# Patient Record
Sex: Male | Born: 1970 | Race: Black or African American | Hispanic: No | Marital: Married | Smoking: Never smoker
Health system: Southern US, Community
[De-identification: ages and names within clinical notes are randomized; demographics above are authoritative.]

## PROBLEM LIST (undated history)

## (undated) DIAGNOSIS — I10 Essential (primary) hypertension: Secondary | ICD-10-CM

---

## 2004-11-17 ENCOUNTER — Emergency Department: Payer: Self-pay | Admitting: Emergency Medicine

## 2004-11-25 ENCOUNTER — Encounter: Payer: Self-pay | Admitting: Family Medicine

## 2004-12-12 ENCOUNTER — Encounter: Payer: Self-pay | Admitting: Family Medicine

## 2009-06-08 ENCOUNTER — Emergency Department (HOSPITAL_COMMUNITY): Admission: EM | Admit: 2009-06-08 | Discharge: 2009-06-08 | Payer: Self-pay | Admitting: Emergency Medicine

## 2010-09-14 ENCOUNTER — Emergency Department (HOSPITAL_COMMUNITY)
Admission: EM | Admit: 2010-09-14 | Discharge: 2010-09-14 | Payer: Self-pay | Source: Home / Self Care | Admitting: Emergency Medicine

## 2010-11-23 LAB — DIFFERENTIAL
Basophils Absolute: 0.1 10*3/uL (ref 0.0–0.1)
Basophils Relative: 3 % — ABNORMAL HIGH (ref 0–1)
Eosinophils Absolute: 0.2 10*3/uL (ref 0.0–0.7)
Eosinophils Relative: 5 % (ref 0–5)
Lymphocytes Relative: 44 % (ref 12–46)
Lymphs Abs: 1.8 10*3/uL (ref 0.7–4.0)
Monocytes Absolute: 0.4 10*3/uL (ref 0.1–1.0)
Monocytes Relative: 10 % (ref 3–12)
Neutro Abs: 1.6 10*3/uL — ABNORMAL LOW (ref 1.7–7.7)
Neutrophils Relative %: 38 % — ABNORMAL LOW (ref 43–77)

## 2010-11-23 LAB — URINALYSIS, ROUTINE W REFLEX MICROSCOPIC
Bilirubin Urine: NEGATIVE
Glucose, UA: NEGATIVE mg/dL
Ketones, ur: NEGATIVE mg/dL
Leukocytes, UA: NEGATIVE
Nitrite: NEGATIVE
Protein, ur: NEGATIVE mg/dL
Specific Gravity, Urine: 1.02 (ref 1.005–1.030)
Urobilinogen, UA: 0.2 mg/dL (ref 0.0–1.0)
pH: 6 (ref 5.0–8.0)

## 2010-11-23 LAB — CBC
HCT: 42.8 % (ref 39.0–52.0)
Hemoglobin: 15 g/dL (ref 13.0–17.0)
MCH: 28.5 pg (ref 26.0–34.0)
MCHC: 35 g/dL (ref 30.0–36.0)
MCV: 81.4 fL (ref 78.0–100.0)
Platelets: 129 10*3/uL — ABNORMAL LOW (ref 150–400)
RBC: 5.26 MIL/uL (ref 4.22–5.81)
RDW: 14.3 % (ref 11.5–15.5)
WBC: 4.2 10*3/uL (ref 4.0–10.5)

## 2010-11-23 LAB — URINE CULTURE
Colony Count: 2000
Culture  Setup Time: 201201021224

## 2010-11-23 LAB — BASIC METABOLIC PANEL
BUN: 6 mg/dL (ref 6–23)
CO2: 26 mEq/L (ref 19–32)
Calcium: 9.5 mg/dL (ref 8.4–10.5)
Chloride: 104 mEq/L (ref 96–112)
Creatinine, Ser: 1.03 mg/dL (ref 0.4–1.5)
GFR calc Af Amer: >60 mL/min (ref >60)
GFR calc non Af Amer: >60 mL/min (ref >60)
Glucose, Bld: 120 mg/dL — ABNORMAL HIGH (ref 70–99)
Potassium: 4.4 mEq/L (ref 3.5–5.1)
Sodium: 136 mEq/L (ref 135–145)

## 2010-11-23 LAB — URINE MICROSCOPIC-ADD ON

## 2011-10-17 ENCOUNTER — Emergency Department (HOSPITAL_COMMUNITY): Payer: Self-pay

## 2011-10-17 ENCOUNTER — Emergency Department (HOSPITAL_COMMUNITY)
Admission: EM | Admit: 2011-10-17 | Discharge: 2011-10-17 | Disposition: A | Discharge status: Home / Self Care | Payer: Self-pay | Attending: Emergency Medicine | Admitting: Emergency Medicine

## 2011-10-17 ENCOUNTER — Encounter (HOSPITAL_COMMUNITY): Payer: Self-pay | Admitting: *Deleted

## 2011-10-17 DIAGNOSIS — R599 Enlarged lymph nodes, unspecified: Secondary | ICD-10-CM | POA: Insufficient documentation

## 2011-10-17 DIAGNOSIS — R591 Generalized enlarged lymph nodes: Secondary | ICD-10-CM

## 2011-10-17 DIAGNOSIS — R1310 Dysphagia, unspecified: Secondary | ICD-10-CM | POA: Insufficient documentation

## 2011-10-17 DIAGNOSIS — I10 Essential (primary) hypertension: Secondary | ICD-10-CM | POA: Insufficient documentation

## 2011-10-17 DIAGNOSIS — R070 Pain in throat: Secondary | ICD-10-CM | POA: Insufficient documentation

## 2011-10-17 HISTORY — DX: Essential (primary) hypertension: I10

## 2011-10-17 MED ORDER — HYDROCODONE-ACETAMINOPHEN 5-325 MG PO TABS: ORAL_TABLET | ORAL | Status: AC

## 2011-10-17 MED ORDER — IOHEXOL 300 MG/ML  SOLN
75.0000 mL | Freq: Once | INTRAMUSCULAR | Status: AC | PRN
  Administered 2011-10-17: 75 mL via INTRAVENOUS

## 2011-10-17 MED ORDER — NAPROXEN 500 MG PO TABS: 500.0000 mg | ORAL_TABLET | Freq: Two times a day (BID) | ORAL | Status: AC

## 2011-10-17 NOTE — ED Notes (Signed)
Pt states that it feels like something has been stuck in his throat x 1 week. Denies problems swallowing, eating or drinking. Denies sore throat. Pt states that it started suddenly and he was not eating at the time.

## 2011-10-17 NOTE — ED Provider Notes (Signed)
History     CSN: [id number]  Arrival date & time 10/17/11  0940   First MD Initiated Contact with Patient 10/17/11 1006      Chief Complaint  Patient presents with  . Oral Swelling    (Consider location/radiation/quality/duration/timing/severity/associated sxs/prior treatment) HPI Comments: Patient c/o foreign body sensation in this throat for approximately one week.  States that "it feels like something in my throat when I swallow".  He denies recent illness, pain with swallowing or eating fish recently.  He states the sensation is similar with fluids or solid food.  He denies vomiting.  He also denies neck pain.  States he tried OTC anti-acid w/o relief  Patient is a 41 y.o. male presenting with pharyngitis. The history is provided by the patient. No language interpreter was used.  Sore Throat This is a new problem. The current episode started 1 to 4 weeks ago. The problem occurs constantly. The problem has been unchanged. Associated symptoms include a sore throat. Pertinent negatives include no abdominal pain, chest pain, congestion, coughing, fever, headaches, joint swelling, nausea, neck pain, numbness, rash, swollen glands, vomiting or weakness. The symptoms are aggravated by swallowing. He has tried nothing for the symptoms. The treatment provided mild relief.    Past Medical History  Diagnosis Date  . Hypertension     History reviewed. No pertinent past surgical history.  History reviewed. No pertinent family history.  History  Substance Use Topics  . Smoking status: Never Smoker   . Smokeless tobacco: Not on file  . Alcohol Use: No      Review of Systems  Constitutional: Negative for fever, activity change and appetite change.  HENT: Positive for sore throat and trouble swallowing. Negative for congestion, facial swelling, rhinorrhea, sneezing, drooling, neck pain, neck stiffness, dental problem and sinus pressure.   Respiratory: Negative for cough.     Cardiovascular: Negative for chest pain.  Gastrointestinal: Negative for nausea, vomiting and abdominal pain.  Musculoskeletal: Negative for joint swelling.  Skin: Negative for rash.  Neurological: Negative for weakness, numbness and headaches.  Hematological: Negative for adenopathy.  All other systems reviewed and are negative.    Allergies  Review of patient's allergies indicates no known allergies.  Home Medications   Current Outpatient Rx  Name Route Sig Dispense Refill  . LISINOPRIL 40 MG PO TABS Oral Take 40 mg by mouth daily.    Marland Kitchen RANITIDINE HCL 150 MG PO TABS Oral Take 150 mg by mouth daily as needed. For heartburn      BP 147/78  Pulse 73  Temp(Src) 98.2 F (36.8 C) (Oral)  Resp 18  Ht 5\' 7"  (1.702 m)  Wt 240 lb (108.863 kg)  BMI 37.59 kg/m2  SpO2 98%  Physical Exam  Nursing note and vitals reviewed. Constitutional: He is oriented to person, place, and time. He appears well-developed and well-nourished. No distress.  HENT:  Head: Normocephalic and atraumatic. No trismus in the jaw.  Mouth/Throat: Uvula is midline, oropharynx is clear and moist and mucous membranes are normal. No oral lesions. No uvula swelling, lacerations or dental caries.       Oropharynx appears nml.  No FB seen  Neck: Normal range of motion. Neck supple. No tracheal deviation present. No thyromegaly present.  Cardiovascular: Normal rate, regular rhythm and normal heart sounds.   No murmur heard. Pulmonary/Chest: Effort normal and breath sounds normal. No stridor. No respiratory distress. He exhibits no tenderness.  Abdominal: Soft. He exhibits no distension. There is no tenderness.  Musculoskeletal: Normal range of motion. He exhibits no tenderness.  Lymphadenopathy:    He has no cervical adenopathy.  Neurological: He is alert and oriented to person, place, and time. He exhibits normal muscle tone. Coordination normal.  Skin: Skin is warm and dry.    ED Course  Procedures (including  critical care time)   Labs Reviewed  TSH   Ct Soft Tissue Neck W Contrast  10/17/2011  *RADIOLOGY REPORT*  Clinical Data: Oral swelling.  The patient feels like he has something stuck in his throat for the past week.  CT NECK WITH CONTRAST  Technique:  Multidetector CT imaging of the neck was performed with intravenous contrast.  Contrast: 75mL OMNIPAQUE IOHEXOL 300 MG/ML IV SOLN  Comparison: None.  Findings: No focal mucosal or submucosal lesions are present.  The vocal cords are midline and symmetric.  The tongue base is unremarkable.  Enlarged right level II lymph nodes are present.  The largest node measures 2.8 x 2.0 cm on the sagittal reformats.  A slightly smaller and more superior node is 2.5 x 1.5 cm.  Just superior to that node is a 2.0 x 1.4 cm node.  No significant left-sided adenopathy is present.  Smaller posterior right level II lymph nodes are present.  No significant lower adenopathy is evident.  The thyroid gland is within normal limits.  Limited imaging of the brain is unremarkable.  The lung apices are clear.  The bone windows are unremarkable.  IMPRESSION:  1.  Enlarged right level II adenopathy.  This raises concern for neoplasm.  Inflammatory adenopathy could have this appearance, but appears fairly localized.  A primary lymphoid neoplasm or dysplasia is also considered. 2.  No primary mucosal lesion is evident.  Original Report Authenticated By: Jamesetta Orleans. MATTERN, M.D.    [facility] results are pending    MDM    Patient has remained stable during emergency department visit.  Vitals also stable. No acute distress.  Airway remains patent. No edema.  Handles secretions well.  I have advised the patient of the CT findings today and he agrees to close followup with ENT. Care plan was discussed with EDP prior to d/c   Patient / Family / Caregiver understand and agree with initial ED impression and plan with expectations set for ED visit.      Jshawn Hurta L. Makynli Stills, [place] 10/19/11  1601

## 2011-10-18 LAB — TSH: TSH: 0.88 u[IU]/mL (ref 0.350–4.500)

## 2011-10-19 NOTE — ED Provider Notes (Signed)
Medical screening examination/treatment/procedure(s) were performed by non-physician practitioner and as supervising physician I was immediately available for consultation/collaboration.   Benny Lennert, MD 10/19/11 1622

## 2012-04-09 ENCOUNTER — Emergency Department (HOSPITAL_COMMUNITY)
Admission: EM | Admit: 2012-04-09 | Discharge: 2012-04-09 | Disposition: A | Discharge status: Home / Self Care | Payer: Self-pay | Attending: Emergency Medicine | Admitting: Emergency Medicine

## 2012-04-09 ENCOUNTER — Encounter (HOSPITAL_COMMUNITY): Payer: Self-pay

## 2012-04-09 DIAGNOSIS — R591 Generalized enlarged lymph nodes: Secondary | ICD-10-CM

## 2012-04-09 DIAGNOSIS — R220 Localized swelling, mass and lump, head: Secondary | ICD-10-CM | POA: Insufficient documentation

## 2012-04-09 DIAGNOSIS — I10 Essential (primary) hypertension: Secondary | ICD-10-CM | POA: Insufficient documentation

## 2012-04-09 MED ORDER — AMOXICILLIN 500 MG PO CAPS: 500.0000 mg | ORAL_CAPSULE | Freq: Three times a day (TID) | ORAL | Status: AC

## 2012-04-09 NOTE — ED Provider Notes (Signed)
History     CSN: [id number]  Arrival date & time 04/09/12  1143   First MD Initiated Contact with Patient 04/09/12 1229      Chief Complaint  Patient presents with  . Neck Pain    (Consider location/radiation/quality/duration/timing/severity/associated sxs/prior treatment) HPI Comments: Patient c/o pain to the right side of his neck for 2 days.  Also reports having an occasional cough and slight nasal congestion for several days.  States the  Pain to his neck is worse with movement and improves with rest.  He denies fever, sore throat, difficulty swallowing or breathing.    The history is provided by the patient.    Past Medical History  Diagnosis Date  . Hypertension     History reviewed. No pertinent past surgical history.  No family history on file.  History  Substance Use Topics  . Smoking status: Never Smoker   . Smokeless tobacco: Not on file  . Alcohol Use: No      Review of Systems  Constitutional: Negative for fever, chills, activity change, appetite change, fatigue and unexpected weight change.  HENT: Positive for congestion and neck pain. Negative for ear pain, sore throat, facial swelling, mouth sores, trouble swallowing, neck stiffness, dental problem, voice change, sinus pressure and ear discharge.   Respiratory: Negative for cough, chest tightness, shortness of breath, wheezing and stridor.   Cardiovascular: Negative for chest pain.  Gastrointestinal: Negative for nausea and vomiting.  Musculoskeletal: Negative for arthralgias.  Skin: Negative for color change.  Neurological: Negative for dizziness, facial asymmetry, speech difficulty, light-headedness and headaches.  Hematological: Positive for adenopathy. Does not bruise/bleed easily.  Psychiatric/Behavioral: Negative for confusion and decreased concentration.  All other systems reviewed and are negative.    Allergies  Review of patient's allergies indicates no known allergies.  Home  Medications   Current Outpatient Rx  Name Route Sig Dispense Refill  . LISINOPRIL 40 MG PO TABS Oral Take 40 mg by mouth daily.    Marland Kitchen NAPROXEN 500 MG PO TABS Oral Take 1 tablet (500 mg total) by mouth 2 (two) times daily. Take with food 20 tablet 0  . RANITIDINE HCL 150 MG PO TABS Oral Take 150 mg by mouth daily as needed. For heartburn      BP 134/94  Pulse 69  Temp 98.5 F (36.9 C) (Oral)  Resp 18  Ht 5\' 6"  (1.676 m)  Wt 230 lb (104.327 kg)  BMI 37.12 kg/m2  SpO2 100%  Physical Exam  Nursing note and vitals reviewed. Constitutional: He is oriented to person, place, and time. He appears well-developed and well-nourished. No distress.  HENT:  Head: Normocephalic and atraumatic. No trismus in the jaw.  Right Ear: Tympanic membrane and ear canal normal. No mastoid tenderness. No hemotympanum.  Left Ear: Tympanic membrane and ear canal normal. No mastoid tenderness. No hemotympanum.  Nose: Nose normal. Right sinus exhibits no maxillary sinus tenderness and no frontal sinus tenderness. Left sinus exhibits no maxillary sinus tenderness and no frontal sinus tenderness.  Mouth/Throat: Uvula is midline, oropharynx is clear and moist and mucous membranes are normal. No uvula swelling or dental caries.  Neck: Full passive range of motion without pain and phonation normal. No JVD present. No tracheal tenderness and no spinous process tenderness present. Carotid bruit is not present. No rigidity. No tracheal deviation, no edema, no erythema and normal range of motion present. No Brudzinski's sign and no Kernig's sign noted. Thyromegaly present.  Cardiovascular: Normal rate, regular rhythm, normal heart  sounds and intact distal pulses.   No murmur heard. Pulmonary/Chest: Effort normal and breath sounds normal. No stridor.  Musculoskeletal: He exhibits no edema.  Lymphadenopathy:    He has cervical adenopathy.       Right cervical: Superficial cervical and posterior cervical adenopathy present.         Left cervical: No superficial cervical, no deep cervical and no posterior cervical adenopathy present.    He has no axillary adenopathy.       Right: No supraclavicular adenopathy present.       Left: No supraclavicular adenopathy present.       three moderately enlg lymph nodes to the right anterior and posterior neck.    Neurological: He is alert and oriented to person, place, and time. He exhibits normal muscle tone. Coordination normal.  Skin: Skin is dry.    ED Course  Procedures (including critical care time)  Labs Reviewed - No data to display      MDM    Multiple dime sized palpable nodules to the right neck.  Thyromegaly.  I have advised pt to have further eval of the nodules including labs and CT scan of the soft tissues of the neck, but he refused stated that he prefers to try antibiotics first. Requests an abx that's on the $4.00 formulary.   He agrees to return here if the sx's are not improving.  I have discussed the diff diagnosis with him including possibility of CA.  He verbalized understanding of this to me and agreed to return or have close f/u with his PMD if sx's do not improve with use of abx.  He is non-toxic appearing, vital are stable and appears stable for d/c at this time.    The patient appears reasonably screened and/or stabilized for discharge and I doubt any other medical condition or other [facility] requiring further screening, evaluation, or treatment in the ED at this time prior to discharge.   Prescribed:  Naprosyn amoxil         Shoshanna Mcquitty L. Lamarion Mcevers, [place] 04/14/12 1627

## 2012-04-09 NOTE — ED Notes (Signed)
Pt reports enlarged lymph nodes on r side of neck x 2 days.  Reports has had a nonproductive cough for the past 3 or 4 days.  Denies sore throat.

## 2012-04-19 NOTE — ED Provider Notes (Signed)
Medical screening examination/treatment/procedure(s) were conducted as a shared visit with non-physician practitioner(s) and myself.  I personally evaluated the patient during the encounter.  Patient understands that right sided neck nodules could be secondary to cancer. He understands that a CT scan needs to be performed.  He takes full responsibility for followup for his neck nodule  Donnetta Hutching, MD 04/19/12 [phone]

## 2012-05-29 ENCOUNTER — Other Ambulatory Visit (HOSPITAL_COMMUNITY): Payer: Self-pay | Admitting: *Deleted

## 2012-05-29 DIAGNOSIS — I889 Nonspecific lymphadenitis, unspecified: Secondary | ICD-10-CM

## 2012-06-12 ENCOUNTER — Ambulatory Visit (HOSPITAL_COMMUNITY)
Admission: RE | Admit: 2012-06-12 | Discharge: 2012-06-12 | Disposition: A | Discharge status: Home / Self Care | Payer: BC Managed Care – PPO | Source: Ambulatory Visit | Attending: General Practice | Admitting: General Practice

## 2012-06-12 ENCOUNTER — Other Ambulatory Visit (HOSPITAL_COMMUNITY): Payer: Self-pay | Admitting: *Deleted

## 2012-06-12 DIAGNOSIS — I889 Nonspecific lymphadenitis, unspecified: Secondary | ICD-10-CM

## 2012-07-03 ENCOUNTER — Ambulatory Visit: Payer: Self-pay | Admitting: Hematology and Oncology

## 2012-07-05 ENCOUNTER — Ambulatory Visit: Payer: Self-pay | Admitting: Hematology and Oncology

## 2012-07-05 LAB — COMPREHENSIVE METABOLIC PANEL
Albumin: 4.1 g/dL (ref 3.4–5.0)
Alkaline Phosphatase: 73 U/L (ref 50–136)
Anion Gap: 8 (ref 7–16)
BUN: 10 mg/dL (ref 7–18)
Bilirubin,Total: 0.5 mg/dL (ref 0.2–1.0)
Calcium, Total: 8.9 mg/dL (ref 8.5–10.1)
Chloride: 108 mmol/L — ABNORMAL HIGH (ref 98–107)
Co2: 26 mmol/L (ref 21–32)
Creatinine: 1 mg/dL (ref 0.60–1.30)
EGFR (African American): >60
EGFR (Non-African Amer.): >60
Glucose: 112 mg/dL — ABNORMAL HIGH (ref 65–99)
Osmolality: 283 (ref 275–301)
Potassium: 4 mmol/L (ref 3.5–5.1)
SGOT(AST): 25 U/L (ref 15–37)
SGPT (ALT): 26 U/L (ref 12–78)
Sodium: 142 mmol/L (ref 136–145)
Total Protein: 7.8 g/dL (ref 6.4–8.2)

## 2012-07-05 LAB — CBC CANCER CENTER
Basophil: 1 %
Comment - H1-Com2: NORMAL
Eosinophil: 10 %
HCT: 43.6 % (ref 40.0–52.0)
HGB: 14.2 g/dL (ref 13.0–18.0)
Lymphocytes: 43 %
MCH: 27.9 pg (ref 26.0–34.0)
MCHC: 32.5 g/dL (ref 32.0–36.0)
MCV: 86 fL (ref 80–100)
Monocytes: 7 %
Platelet: 189 x10 3/mm (ref 150–440)
RBC: 5.08 10*6/uL (ref 4.40–5.90)
RDW: 14.1 % (ref 11.5–14.5)
Segmented Neutrophils: 34 %
Variant Lymphocyte: 5 %
WBC: 5.6 x10 3/mm (ref 3.8–10.6)

## 2012-07-05 LAB — DIFFERENTIAL
Basophil: 1 %
Comment - H1-Com2: NORMAL
Eosinophil: 10 %
Lymphocytes: 43 %
Monocytes: 7 %
Segmented Neutrophils: 34 %
Variant Lymphocyte - H1-Rlymph: 5 %

## 2012-07-14 ENCOUNTER — Ambulatory Visit: Payer: Self-pay | Admitting: Hematology and Oncology

## 2012-07-20 ENCOUNTER — Ambulatory Visit: Payer: Self-pay | Admitting: Hematology and Oncology

## 2012-07-21 LAB — COMPREHENSIVE METABOLIC PANEL
Albumin: 4.2 g/dL (ref 3.4–5.0)
Alkaline Phosphatase: 75 U/L (ref 50–136)
Anion Gap: 12 (ref 7–16)
BUN: 19 mg/dL — ABNORMAL HIGH (ref 7–18)
Bilirubin,Total: 0.6 mg/dL (ref 0.2–1.0)
Calcium, Total: 9.4 mg/dL (ref 8.5–10.1)
Chloride: 101 mmol/L (ref 98–107)
Co2: 26 mmol/L (ref 21–32)
Creatinine: 1.44 mg/dL — ABNORMAL HIGH (ref 0.60–1.30)
EGFR (African American): >60
EGFR (Non-African Amer.): 60 — ABNORMAL LOW
Glucose: 157 mg/dL — ABNORMAL HIGH (ref 65–99)
Osmolality: 283 (ref 275–301)
Potassium: 3.8 mmol/L (ref 3.5–5.1)
SGOT(AST): 22 U/L (ref 15–37)
SGPT (ALT): 31 U/L (ref 12–78)
Sodium: 139 mmol/L (ref 136–145)
Total Protein: 8.2 g/dL (ref 6.4–8.2)

## 2012-07-21 LAB — CBC CANCER CENTER
Basophil #: 0 x10 3/mm (ref 0.0–0.1)
Basophil %: 0.8 %
Eosinophil #: 0.3 x10 3/mm (ref 0.0–0.7)
Eosinophil %: 5 %
HCT: 45 % (ref 40.0–52.0)
HGB: 14.6 g/dL (ref 13.0–18.0)
Lymphocyte #: 3.2 x10 3/mm (ref 1.0–3.6)
Lymphocyte %: 52.5 %
MCH: 28 pg (ref 26.0–34.0)
MCHC: 32.5 g/dL (ref 32.0–36.0)
MCV: 86 fL (ref 80–100)
Monocyte #: 0.5 x10 3/mm (ref 0.2–1.0)
Monocyte %: 8.9 %
Neutrophil #: 2 x10 3/mm (ref 1.4–6.5)
Neutrophil %: 32.8 %
Platelet: 199 x10 3/mm (ref 150–440)
RBC: 5.23 10*6/uL (ref 4.40–5.90)
RDW: 14.6 % — ABNORMAL HIGH (ref 11.5–14.5)
WBC: 6.1 x10 3/mm (ref 3.8–10.6)

## 2012-08-07 ENCOUNTER — Ambulatory Visit: Payer: Self-pay | Admitting: Unknown Physician Specialty

## 2012-08-07 LAB — APTT: Activated PTT: 28.1 secs (ref 23.6–35.9)

## 2012-08-07 LAB — PROTIME-INR
INR: 0.9
Prothrombin Time: 12.9 secs (ref 11.5–14.7)

## 2012-08-13 ENCOUNTER — Ambulatory Visit: Payer: Self-pay | Admitting: Hematology and Oncology

## 2012-08-14 ENCOUNTER — Ambulatory Visit: Payer: Self-pay | Admitting: Unknown Physician Specialty

## 2012-08-16 LAB — PATHOLOGY REPORT

## 2012-08-18 LAB — CBC CANCER CENTER
Basophil #: 0.1 x10 3/mm (ref 0.0–0.1)
Basophil %: 0.7 %
Eosinophil #: 0.3 x10 3/mm (ref 0.0–0.7)
Eosinophil %: 3.6 %
HCT: 44.7 % (ref 40.0–52.0)
HGB: 15.3 g/dL (ref 13.0–18.0)
Lymphocyte #: 3.4 x10 3/mm (ref 1.0–3.6)
Lymphocyte %: 42.3 %
MCH: 28.7 pg (ref 26.0–34.0)
MCHC: 34.1 g/dL (ref 32.0–36.0)
MCV: 84 fL (ref 80–100)
Monocyte #: 0.8 x10 3/mm (ref 0.2–1.0)
Monocyte %: 9.9 %
Neutrophil #: 3.5 x10 3/mm (ref 1.4–6.5)
Neutrophil %: 43.5 %
Platelet: 222 x10 3/mm (ref 150–440)
RBC: 5.31 10*6/uL (ref 4.40–5.90)
RDW: 14.7 % — ABNORMAL HIGH (ref 11.5–14.5)
WBC: 8.1 x10 3/mm (ref 3.8–10.6)

## 2012-08-18 LAB — COMPREHENSIVE METABOLIC PANEL
Albumin: 4.5 g/dL (ref 3.4–5.0)
Alkaline Phosphatase: 99 U/L (ref 50–136)
Anion Gap: 14 (ref 7–16)
BUN: 14 mg/dL (ref 7–18)
Bilirubin,Total: 0.4 mg/dL (ref 0.2–1.0)
Calcium, Total: 10.1 mg/dL (ref 8.5–10.1)
Chloride: 100 mmol/L (ref 98–107)
Co2: 23 mmol/L (ref 21–32)
Creatinine: 1.37 mg/dL — ABNORMAL HIGH (ref 0.60–1.30)
EGFR (African American): >60
EGFR (Non-African Amer.): >60
Glucose: 267 mg/dL — ABNORMAL HIGH (ref 65–99)
Osmolality: 284 (ref 275–301)
Potassium: 4 mmol/L (ref 3.5–5.1)
SGOT(AST): 43 U/L — ABNORMAL HIGH (ref 15–37)
SGPT (ALT): 46 U/L (ref 12–78)
Sodium: 137 mmol/L (ref 136–145)
Total Protein: 8.7 g/dL — ABNORMAL HIGH (ref 6.4–8.2)

## 2012-08-18 LAB — PROTIME-INR
INR: 1
Prothrombin Time: 13.4 secs (ref 11.5–14.7)

## 2012-08-21 ENCOUNTER — Ambulatory Visit: Payer: Self-pay | Admitting: Anesthesiology

## 2012-08-22 ENCOUNTER — Ambulatory Visit: Payer: Self-pay | Admitting: Unknown Physician Specialty

## 2012-08-28 LAB — PATHOLOGY REPORT

## 2012-08-29 LAB — CBC CANCER CENTER
Basophil #: 0 x10 3/mm (ref 0.0–0.1)
Basophil %: 0.5 %
Eosinophil #: 0.2 x10 3/mm (ref 0.0–0.7)
Eosinophil %: 3.7 %
HCT: 39.1 % — ABNORMAL LOW (ref 40.0–52.0)
HGB: 13.5 g/dL (ref 13.0–18.0)
Lymphocyte #: 3.2 x10 3/mm (ref 1.0–3.6)
Lymphocyte %: 49.5 %
MCH: 29.1 pg (ref 26.0–34.0)
MCHC: 34.5 g/dL (ref 32.0–36.0)
MCV: 84 fL (ref 80–100)
Monocyte #: 0.5 x10 3/mm (ref 0.2–1.0)
Monocyte %: 8.4 %
Neutrophil #: 2.4 x10 3/mm (ref 1.4–6.5)
Neutrophil %: 37.9 %
Platelet: 232 x10 3/mm (ref 150–440)
RBC: 4.63 10*6/uL (ref 4.40–5.90)
RDW: 14.3 % (ref 11.5–14.5)
WBC: 6.4 x10 3/mm (ref 3.8–10.6)

## 2012-08-29 LAB — COMPREHENSIVE METABOLIC PANEL
Albumin: 3.8 g/dL (ref 3.4–5.0)
Alkaline Phosphatase: 85 U/L (ref 50–136)
Anion Gap: 10 (ref 7–16)
BUN: 10 mg/dL (ref 7–18)
Bilirubin,Total: 0.3 mg/dL (ref 0.2–1.0)
Calcium, Total: 8.8 mg/dL (ref 8.5–10.1)
Chloride: 102 mmol/L (ref 98–107)
Co2: 26 mmol/L (ref 21–32)
Creatinine: 1.09 mg/dL (ref 0.60–1.30)
EGFR (African American): >60
EGFR (Non-African Amer.): >60
Glucose: 236 mg/dL — ABNORMAL HIGH (ref 65–99)
Osmolality: 282 (ref 275–301)
Potassium: 4.1 mmol/L (ref 3.5–5.1)
SGOT(AST): 25 U/L (ref 15–37)
SGPT (ALT): 35 U/L (ref 12–78)
Sodium: 138 mmol/L (ref 136–145)
Total Protein: 7.8 g/dL (ref 6.4–8.2)

## 2012-08-29 LAB — LACTATE DEHYDROGENASE: LDH: 197 U/L (ref 85–241)

## 2012-08-30 LAB — COMPREHENSIVE METABOLIC PANEL
Albumin: 3.7 g/dL (ref 3.4–5.0)
Alkaline Phosphatase: 87 U/L (ref 50–136)
Anion Gap: 10 (ref 7–16)
BUN: 10 mg/dL (ref 7–18)
Bilirubin,Total: 0.4 mg/dL (ref 0.2–1.0)
Calcium, Total: 9.2 mg/dL (ref 8.5–10.1)
Chloride: 103 mmol/L (ref 98–107)
Co2: 26 mmol/L (ref 21–32)
Creatinine: 1.32 mg/dL — ABNORMAL HIGH (ref 0.60–1.30)
EGFR (African American): >60
EGFR (Non-African Amer.): >60
Glucose: 258 mg/dL — ABNORMAL HIGH (ref 65–99)
Osmolality: 285 (ref 275–301)
Potassium: 4 mmol/L (ref 3.5–5.1)
SGOT(AST): 21 U/L (ref 15–37)
SGPT (ALT): 38 U/L (ref 12–78)
Sodium: 139 mmol/L (ref 136–145)
Total Protein: 7.8 g/dL (ref 6.4–8.2)

## 2012-08-30 LAB — CBC CANCER CENTER
Comment - H1-Com2: NORMAL
Eosinophil: 1 %
HCT: 40.7 % (ref 40.0–52.0)
HGB: 13.7 g/dL (ref 13.0–18.0)
Lymphocytes: 19 %
MCH: 28.8 pg (ref 26.0–34.0)
MCHC: 33.6 g/dL (ref 32.0–36.0)
MCV: 86 fL (ref 80–100)
Monocytes: 4 %
Platelet: 244 x10 3/mm (ref 150–440)
RBC: 4.74 10*6/uL (ref 4.40–5.90)
RDW: 14.3 % (ref 11.5–14.5)
Segmented Neutrophils: 53 %
Variant Lymphocyte: 23 %
WBC: 6.7 x10 3/mm (ref 3.8–10.6)

## 2012-09-07 LAB — CBC CANCER CENTER
Basophil #: 0 x10 3/mm (ref 0.0–0.1)
Basophil %: 0.8 %
Eosinophil #: 0.1 x10 3/mm (ref 0.0–0.7)
Eosinophil %: 6.6 %
HCT: 41.1 % (ref 40.0–52.0)
HGB: 13.7 g/dL (ref 13.0–18.0)
Lymphocyte #: 1.2 x10 3/mm (ref 1.0–3.6)
Lymphocyte %: 61.1 %
MCH: 28.7 pg (ref 26.0–34.0)
MCHC: 33.2 g/dL (ref 32.0–36.0)
MCV: 86 fL (ref 80–100)
Monocyte #: 0.1 x10 3/mm — ABNORMAL LOW (ref 0.2–1.0)
Monocyte %: 5.1 %
Neutrophil #: 0.5 x10 3/mm — ABNORMAL LOW (ref 1.4–6.5)
Neutrophil %: 26.4 %
Platelet: 97 x10 3/mm — ABNORMAL LOW (ref 150–440)
RBC: 4.77 10*6/uL (ref 4.40–5.90)
RDW: 14.3 % (ref 11.5–14.5)
WBC: 2 x10 3/mm — CL (ref 3.8–10.6)

## 2012-09-07 LAB — COMPREHENSIVE METABOLIC PANEL
Albumin: 3.7 g/dL (ref 3.4–5.0)
Alkaline Phosphatase: 99 U/L (ref 50–136)
Anion Gap: 14 (ref 7–16)
BUN: 18 mg/dL (ref 7–18)
Bilirubin,Total: 0.9 mg/dL (ref 0.2–1.0)
Calcium, Total: 8.7 mg/dL (ref 8.5–10.1)
Chloride: 94 mmol/L — ABNORMAL LOW (ref 98–107)
Co2: 25 mmol/L (ref 21–32)
Creatinine: 1.67 mg/dL — ABNORMAL HIGH (ref 0.60–1.30)
EGFR (African American): 58 — ABNORMAL LOW
EGFR (Non-African Amer.): 50 — ABNORMAL LOW
Glucose: 488 mg/dL — ABNORMAL HIGH (ref 65–99)
Osmolality: 290 (ref 275–301)
Potassium: 4.4 mmol/L (ref 3.5–5.1)
SGOT(AST): 11 U/L — ABNORMAL LOW (ref 15–37)
SGPT (ALT): 25 U/L (ref 12–78)
Sodium: 133 mmol/L — ABNORMAL LOW (ref 136–145)
Total Protein: 7.5 g/dL (ref 6.4–8.2)

## 2012-09-13 ENCOUNTER — Ambulatory Visit: Payer: Self-pay | Admitting: Hematology and Oncology

## 2012-09-20 LAB — COMPREHENSIVE METABOLIC PANEL
Albumin: 3.8 g/dL (ref 3.4–5.0)
Anion Gap: 10 (ref 7–16)
BUN: 10 mg/dL (ref 7–18)
Bilirubin,Total: 0.1 mg/dL — ABNORMAL LOW (ref 0.2–1.0)
Chloride: 108 mmol/L — ABNORMAL HIGH (ref 98–107)
Co2: 25 mmol/L (ref 21–32)
EGFR (African American): >60
EGFR (Non-African Amer.): >60
Glucose: 117 mg/dL — ABNORMAL HIGH (ref 65–99)
Potassium: 3.9 mmol/L (ref 3.5–5.1)
SGOT(AST): 16 U/L (ref 15–37)
SGPT (ALT): 34 U/L (ref 12–78)
Sodium: 143 mmol/L (ref 136–145)
Total Protein: 6.9 g/dL (ref 6.4–8.2)

## 2012-09-20 LAB — CBC CANCER CENTER
Basophil #: 0.1 x10 3/mm (ref 0.0–0.1)
Eosinophil #: 0 x10 3/mm (ref 0.0–0.7)
HCT: 35 % — ABNORMAL LOW (ref 40.0–52.0)
HGB: 12 g/dL — ABNORMAL LOW (ref 13.0–18.0)
Lymphocyte #: 2.4 x10 3/mm (ref 1.0–3.6)
Lymphocyte %: 30.7 %
MCH: 28.9 pg (ref 26.0–34.0)
Monocyte #: 1 x10 3/mm (ref 0.2–1.0)
Neutrophil %: 55.5 %
Platelet: 175 x10 3/mm (ref 150–440)
RBC: 4.14 10*6/uL — ABNORMAL LOW (ref 4.40–5.90)
RDW: 13.9 % (ref 11.5–14.5)

## 2012-09-27 LAB — CBC CANCER CENTER
Basophil %: 0.9 %
Eosinophil #: 0 x10 3/mm (ref 0.0–0.7)
Eosinophil %: 0.8 %
Lymphocyte #: 1.4 x10 3/mm (ref 1.0–3.6)
MCH: 28.9 pg (ref 26.0–34.0)
Monocyte #: 0.2 x10 3/mm (ref 0.2–1.0)
Monocyte %: 4.5 %
Neutrophil #: 3.5 x10 3/mm (ref 1.4–6.5)
WBC: 5.2 x10 3/mm (ref 3.8–10.6)

## 2012-09-27 LAB — COMPREHENSIVE METABOLIC PANEL
BUN: 10 mg/dL (ref 7–18)
Chloride: 104 mmol/L (ref 98–107)
Glucose: 81 mg/dL (ref 65–99)
SGOT(AST): 8 U/L — ABNORMAL LOW (ref 15–37)
SGPT (ALT): 23 U/L (ref 12–78)
Total Protein: 7.2 g/dL (ref 6.4–8.2)

## 2012-10-11 LAB — COMPREHENSIVE METABOLIC PANEL
Albumin: 3.9 g/dL (ref 3.4–5.0)
Alkaline Phosphatase: 90 U/L (ref 50–136)
Anion Gap: 9 (ref 7–16)
Bilirubin,Total: 0.3 mg/dL (ref 0.2–1.0)
Calcium, Total: 9.3 mg/dL (ref 8.5–10.1)
Co2: 29 mmol/L (ref 21–32)
Creatinine: 1.1 mg/dL (ref 0.60–1.30)
Glucose: 147 mg/dL — ABNORMAL HIGH (ref 65–99)
SGOT(AST): 16 U/L (ref 15–37)
SGPT (ALT): 33 U/L (ref 12–78)
Sodium: 140 mmol/L (ref 136–145)
Total Protein: 7.2 g/dL (ref 6.4–8.2)

## 2012-10-11 LAB — CBC CANCER CENTER
Basophil %: 1.2 %
Eosinophil #: 0 x10 3/mm (ref 0.0–0.7)
Eosinophil %: 0.7 %
Lymphocyte #: 2.1 x10 3/mm (ref 1.0–3.6)
Lymphocyte %: 31.9 %
MCH: 28.7 pg (ref 26.0–34.0)
MCHC: 33.4 g/dL (ref 32.0–36.0)
MCV: 86 fL (ref 80–100)
Monocyte #: 1 x10 3/mm (ref 0.2–1.0)
Neutrophil %: 51.5 %
RBC: 4.22 10*6/uL — ABNORMAL LOW (ref 4.40–5.90)

## 2012-10-14 ENCOUNTER — Ambulatory Visit: Payer: Self-pay | Admitting: Hematology and Oncology

## 2012-11-01 LAB — CBC CANCER CENTER
Basophil #: 0.1 x10 3/mm (ref 0.0–0.1)
Basophil %: 1.3 %
Eosinophil #: 0.1 x10 3/mm (ref 0.0–0.7)
Lymphocyte #: 2 x10 3/mm (ref 1.0–3.6)
MCH: 29.6 pg (ref 26.0–34.0)
MCHC: 33.3 g/dL (ref 32.0–36.0)
MCV: 89 fL (ref 80–100)
Monocyte #: 1.3 x10 3/mm — ABNORMAL HIGH (ref 0.2–1.0)
Monocyte %: 17.4 %
Neutrophil #: 3.8 x10 3/mm (ref 1.4–6.5)
Platelet: 243 x10 3/mm (ref 150–440)
RBC: 3.92 10*6/uL — ABNORMAL LOW (ref 4.40–5.90)
RDW: 18.4 % — ABNORMAL HIGH (ref 11.5–14.5)
WBC: 7.3 x10 3/mm (ref 3.8–10.6)

## 2012-11-01 LAB — COMPREHENSIVE METABOLIC PANEL
Anion Gap: 9 (ref 7–16)
BUN: 8 mg/dL (ref 7–18)
Bilirubin,Total: 0.2 mg/dL (ref 0.2–1.0)
Chloride: 105 mmol/L (ref 98–107)
Co2: 27 mmol/L (ref 21–32)
Creatinine: 1.05 mg/dL (ref 0.60–1.30)
EGFR (African American): >60
EGFR (Non-African Amer.): >60
Osmolality: 283 (ref 275–301)
SGOT(AST): 14 U/L — ABNORMAL LOW (ref 15–37)
SGPT (ALT): 26 U/L (ref 12–78)
Sodium: 141 mmol/L (ref 136–145)
Total Protein: 7.1 g/dL (ref 6.4–8.2)

## 2012-11-11 ENCOUNTER — Ambulatory Visit: Payer: Self-pay | Admitting: Hematology and Oncology

## 2012-11-30 LAB — CBC CANCER CENTER
Basophil %: 0.8 %
HCT: 39.4 % — ABNORMAL LOW (ref 40.0–52.0)
HGB: 13.2 g/dL (ref 13.0–18.0)
Lymphocyte #: 1.1 x10 3/mm (ref 1.0–3.6)
MCH: 29.3 pg (ref 26.0–34.0)
MCHC: 33.4 g/dL (ref 32.0–36.0)
MCV: 88 fL (ref 80–100)
Monocyte #: 0.5 x10 3/mm (ref 0.2–1.0)
Monocyte %: 11 %
Neutrophil #: 2.4 x10 3/mm (ref 1.4–6.5)
Neutrophil %: 57.8 %
Platelet: 148 x10 3/mm — ABNORMAL LOW (ref 150–440)
RDW: 16.2 % — ABNORMAL HIGH (ref 11.5–14.5)

## 2012-11-30 LAB — COMPREHENSIVE METABOLIC PANEL
Albumin: 4 g/dL (ref 3.4–5.0)
Alkaline Phosphatase: 75 U/L (ref 50–136)
Anion Gap: 10 (ref 7–16)
BUN: 12 mg/dL (ref 7–18)
Bilirubin,Total: 0.4 mg/dL (ref 0.2–1.0)
Creatinine: 1.14 mg/dL (ref 0.60–1.30)
EGFR (African American): >60
Osmolality: 283 (ref 275–301)
SGOT(AST): 17 U/L (ref 15–37)
SGPT (ALT): 29 U/L (ref 12–78)
Sodium: 141 mmol/L (ref 136–145)

## 2012-12-07 LAB — CBC CANCER CENTER
Basophil #: 0 x10 3/mm (ref 0.0–0.1)
Eosinophil #: 0.1 x10 3/mm (ref 0.0–0.7)
Eosinophil %: 4.2 %
HCT: 38.7 % — ABNORMAL LOW (ref 40.0–52.0)
HGB: 12.9 g/dL — ABNORMAL LOW (ref 13.0–18.0)
Lymphocyte #: 0.4 x10 3/mm — ABNORMAL LOW (ref 1.0–3.6)
Lymphocyte %: 13.4 %
MCH: 29.2 pg (ref 26.0–34.0)
MCHC: 33.3 g/dL (ref 32.0–36.0)
Monocyte #: 0.5 x10 3/mm (ref 0.2–1.0)
Monocyte %: 14.8 %
Neutrophil #: 2.2 x10 3/mm (ref 1.4–6.5)
Neutrophil %: 66.8 %
RBC: 4.42 10*6/uL (ref 4.40–5.90)
RDW: 16.1 % — ABNORMAL HIGH (ref 11.5–14.5)
WBC: 3.2 x10 3/mm — ABNORMAL LOW (ref 3.8–10.6)

## 2012-12-12 ENCOUNTER — Ambulatory Visit: Payer: Self-pay | Admitting: Hematology and Oncology

## 2013-01-11 ENCOUNTER — Ambulatory Visit: Payer: Self-pay | Admitting: Hematology and Oncology

## 2013-01-11 LAB — COMPREHENSIVE METABOLIC PANEL
Albumin: 4.2 g/dL (ref 3.4–5.0)
Alkaline Phosphatase: 79 U/L (ref 50–136)
Anion Gap: 12 (ref 7–16)
BUN: 15 mg/dL (ref 7–18)
Calcium, Total: 9.6 mg/dL (ref 8.5–10.1)
Co2: 26 mmol/L (ref 21–32)
Creatinine: 1.36 mg/dL — ABNORMAL HIGH (ref 0.60–1.30)
EGFR (African American): >60
Glucose: 88 mg/dL (ref 65–99)
Potassium: 4 mmol/L (ref 3.5–5.1)
SGOT(AST): 20 U/L (ref 15–37)
SGPT (ALT): 37 U/L (ref 12–78)
Sodium: 143 mmol/L (ref 136–145)
Total Protein: 7.4 g/dL (ref 6.4–8.2)

## 2013-01-11 LAB — CBC CANCER CENTER
Eosinophil #: 0.2 x10 3/mm (ref 0.0–0.7)
HCT: 39.8 % — ABNORMAL LOW (ref 40.0–52.0)
HGB: 13 g/dL (ref 13.0–18.0)
Lymphocyte #: 1.2 x10 3/mm (ref 1.0–3.6)
MCH: 28 pg (ref 26.0–34.0)
MCV: 85 fL (ref 80–100)
Monocyte %: 15 %
RBC: 4.66 10*6/uL (ref 4.40–5.90)
WBC: 4.1 x10 3/mm (ref 3.8–10.6)

## 2013-01-12 LAB — PSA: PSA: 0.5 ng/mL (ref 0.0–4.0)

## 2013-02-11 ENCOUNTER — Ambulatory Visit: Payer: Self-pay | Admitting: Hematology and Oncology

## 2013-03-29 ENCOUNTER — Ambulatory Visit: Payer: Self-pay | Admitting: Hematology and Oncology

## 2013-03-30 ENCOUNTER — Ambulatory Visit: Payer: Self-pay | Admitting: Hematology and Oncology

## 2013-04-03 LAB — COMPREHENSIVE METABOLIC PANEL
Albumin: 4.1 g/dL (ref 3.4–5.0)
Alkaline Phosphatase: 83 U/L (ref 50–136)
Anion Gap: 4 — ABNORMAL LOW (ref 7–16)
BUN: 10 mg/dL (ref 7–18)
Bilirubin,Total: 0.4 mg/dL (ref 0.2–1.0)
Calcium, Total: 9.4 mg/dL (ref 8.5–10.1)
Chloride: 107 mmol/L (ref 98–107)
Co2: 29 mmol/L (ref 21–32)
Creatinine: 1.18 mg/dL (ref 0.60–1.30)
EGFR (African American): >60
EGFR (Non-African Amer.): >60
Glucose: 114 mg/dL — ABNORMAL HIGH (ref 65–99)
Osmolality: 279 (ref 275–301)
Potassium: 4.4 mmol/L (ref 3.5–5.1)
SGOT(AST): 15 U/L (ref 15–37)
SGPT (ALT): 25 U/L (ref 12–78)
Sodium: 140 mmol/L (ref 136–145)
Total Protein: 7.3 g/dL (ref 6.4–8.2)

## 2013-04-03 LAB — CBC CANCER CENTER
Basophil #: 0 x10 3/mm (ref 0.0–0.1)
Basophil %: 0.8 %
Eosinophil #: 0.1 x10 3/mm (ref 0.0–0.7)
Eosinophil %: 3.4 %
HCT: 41.2 % (ref 40.0–52.0)
HGB: 13.9 g/dL (ref 13.0–18.0)
Lymphocyte #: 1.1 x10 3/mm (ref 1.0–3.6)
Lymphocyte %: 32.6 %
MCH: 27.3 pg (ref 26.0–34.0)
MCHC: 33.7 g/dL (ref 32.0–36.0)
MCV: 81 fL (ref 80–100)
Monocyte #: 0.4 x10 3/mm (ref 0.2–1.0)
Monocyte %: 12.4 %
Neutrophil #: 1.7 x10 3/mm (ref 1.4–6.5)
Neutrophil %: 50.8 %
Platelet: 167 x10 3/mm (ref 150–440)
RBC: 5.1 10*6/uL (ref 4.40–5.90)
RDW: 16.7 % — ABNORMAL HIGH (ref 11.5–14.5)
WBC: 3.4 x10 3/mm — ABNORMAL LOW (ref 3.8–10.6)

## 2013-04-03 LAB — LACTATE DEHYDROGENASE: LDH: 159 U/L (ref 85–241)

## 2013-04-13 ENCOUNTER — Ambulatory Visit: Payer: Self-pay | Admitting: Hematology and Oncology

## 2013-07-02 ENCOUNTER — Ambulatory Visit: Payer: Self-pay | Admitting: Hematology and Oncology

## 2013-07-16 ENCOUNTER — Ambulatory Visit: Payer: Self-pay | Admitting: Hematology and Oncology

## 2013-07-16 LAB — CBC CANCER CENTER
Basophil #: 0.1 x10 3/mm (ref 0.0–0.1)
Basophil %: 1.2 %
Eosinophil #: 0.1 x10 3/mm (ref 0.0–0.7)
HCT: 43.8 % (ref 40.0–52.0)
HGB: 14.4 g/dL (ref 13.0–18.0)
Lymphocyte #: 1.4 x10 3/mm (ref 1.0–3.6)
MCHC: 32.9 g/dL (ref 32.0–36.0)
MCV: 86 fL (ref 80–100)
Monocyte #: 0.5 x10 3/mm (ref 0.2–1.0)
Monocyte %: 11.1 %
Neutrophil #: 2.5 x10 3/mm (ref 1.4–6.5)
Neutrophil %: 54.9 %
Platelet: 163 x10 3/mm (ref 150–440)
RDW: 15.4 % — ABNORMAL HIGH (ref 11.5–14.5)

## 2013-07-16 LAB — BASIC METABOLIC PANEL
BUN: 11 mg/dL (ref 7–18)
Calcium, Total: 9.4 mg/dL (ref 8.5–10.1)
Chloride: 104 mmol/L (ref 98–107)
Creatinine: 1.21 mg/dL (ref 0.60–1.30)
Glucose: 116 mg/dL — ABNORMAL HIGH (ref 65–99)
Osmolality: 285 (ref 275–301)
Potassium: 4.2 mmol/L (ref 3.5–5.1)
Sodium: 143 mmol/L (ref 136–145)

## 2013-08-13 ENCOUNTER — Ambulatory Visit: Payer: Self-pay | Admitting: Hematology and Oncology

## 2013-10-08 ENCOUNTER — Ambulatory Visit: Payer: Self-pay | Admitting: Hematology and Oncology

## 2013-10-22 ENCOUNTER — Ambulatory Visit: Payer: Self-pay | Admitting: Hematology and Oncology

## 2013-10-22 LAB — CBC CANCER CENTER
BASOS ABS: 0.1 x10 3/mm (ref 0.0–0.1)
Basophil %: 1.2 %
EOS PCT: 2.5 %
Eosinophil #: 0.1 x10 3/mm (ref 0.0–0.7)
HCT: 42.8 % (ref 40.0–52.0)
HGB: 13.9 g/dL (ref 13.0–18.0)
LYMPHS ABS: 1.4 x10 3/mm (ref 1.0–3.6)
Lymphocyte %: 31.2 %
MCH: 27.8 pg (ref 26.0–34.0)
MCHC: 32.4 g/dL (ref 32.0–36.0)
MCV: 86 fL (ref 80–100)
Monocyte #: 0.5 x10 3/mm (ref 0.2–1.0)
Monocyte %: 10.6 %
Neutrophil #: 2.4 x10 3/mm (ref 1.4–6.5)
Neutrophil %: 54.5 %
Platelet: 159 x10 3/mm (ref 150–440)
RBC: 5 10*6/uL (ref 4.40–5.90)
RDW: 15.1 % — ABNORMAL HIGH (ref 11.5–14.5)
WBC: 4.3 x10 3/mm (ref 3.8–10.6)

## 2013-10-22 LAB — COMPREHENSIVE METABOLIC PANEL
Albumin: 3.9 g/dL (ref 3.4–5.0)
Alkaline Phosphatase: 78 U/L
Anion Gap: 10 (ref 7–16)
BUN: 9 mg/dL (ref 7–18)
Bilirubin,Total: 0.4 mg/dL (ref 0.2–1.0)
CHLORIDE: 107 mmol/L (ref 98–107)
Calcium, Total: 9.2 mg/dL (ref 8.5–10.1)
Co2: 23 mmol/L (ref 21–32)
Creatinine: 1.17 mg/dL (ref 0.60–1.30)
EGFR (African American): >60
EGFR (Non-African Amer.): >60
Glucose: 202 mg/dL — ABNORMAL HIGH (ref 65–99)
Osmolality: 284 (ref 275–301)
POTASSIUM: 4.1 mmol/L (ref 3.5–5.1)
SGOT(AST): 21 U/L (ref 15–37)
SGPT (ALT): 37 U/L (ref 12–78)
Sodium: 140 mmol/L (ref 136–145)
Total Protein: 7.2 g/dL (ref 6.4–8.2)

## 2013-10-22 LAB — LACTATE DEHYDROGENASE: LDH: 164 U/L (ref 85–241)

## 2013-11-11 ENCOUNTER — Ambulatory Visit: Payer: Self-pay | Admitting: Hematology and Oncology

## 2014-01-28 ENCOUNTER — Ambulatory Visit: Payer: Self-pay | Admitting: Hematology and Oncology

## 2014-01-28 LAB — CBC CANCER CENTER
BASOS ABS: 0.1 x10 3/mm (ref 0.0–0.1)
Basophil %: 1 %
EOS ABS: 0.1 x10 3/mm (ref 0.0–0.7)
Eosinophil %: 2.7 %
HCT: 42.7 % (ref 40.0–52.0)
HGB: 14 g/dL (ref 13.0–18.0)
LYMPHS ABS: 1.6 x10 3/mm (ref 1.0–3.6)
Lymphocyte %: 32.7 %
MCH: 28.5 pg (ref 26.0–34.0)
MCHC: 32.9 g/dL (ref 32.0–36.0)
MCV: 87 fL (ref 80–100)
Monocyte #: 0.6 x10 3/mm (ref 0.2–1.0)
Monocyte %: 11.6 %
Neutrophil #: 2.5 x10 3/mm (ref 1.4–6.5)
Neutrophil %: 52 %
Platelet: 171 x10 3/mm (ref 150–440)
RBC: 4.93 10*6/uL (ref 4.40–5.90)
RDW: 15.2 % — ABNORMAL HIGH (ref 11.5–14.5)
WBC: 4.9 x10 3/mm (ref 3.8–10.6)

## 2014-01-28 LAB — COMPREHENSIVE METABOLIC PANEL
ALK PHOS: 79 U/L
ANION GAP: 7 (ref 7–16)
Albumin: 4 g/dL (ref 3.4–5.0)
BUN: 8 mg/dL (ref 7–18)
Bilirubin,Total: 0.4 mg/dL (ref 0.2–1.0)
CALCIUM: 9.3 mg/dL (ref 8.5–10.1)
Chloride: 104 mmol/L (ref 98–107)
Co2: 31 mmol/L (ref 21–32)
Creatinine: 1.14 mg/dL (ref 0.60–1.30)
EGFR (African American): >60
GLUCOSE: 140 mg/dL — AB (ref 65–99)
Osmolality: 284 (ref 275–301)
POTASSIUM: 4.1 mmol/L (ref 3.5–5.1)
SGOT(AST): 24 U/L (ref 15–37)
SGPT (ALT): 43 U/L (ref 12–78)
Sodium: 142 mmol/L (ref 136–145)
Total Protein: 7.2 g/dL (ref 6.4–8.2)

## 2014-01-28 LAB — LACTATE DEHYDROGENASE: LDH: 163 U/L (ref 85–241)

## 2014-02-11 ENCOUNTER — Ambulatory Visit: Payer: Self-pay | Admitting: Hematology and Oncology

## 2014-06-03 ENCOUNTER — Ambulatory Visit: Payer: Self-pay | Admitting: Hematology and Oncology

## 2014-06-03 LAB — CBC CANCER CENTER
BASOS ABS: 0.1 x10 3/mm (ref 0.0–0.1)
Basophil %: 1 %
EOS ABS: 0.2 x10 3/mm (ref 0.0–0.7)
Eosinophil %: 4.3 %
HCT: 44.2 % (ref 40.0–52.0)
HGB: 14.5 g/dL (ref 13.0–18.0)
LYMPHS PCT: 33.5 %
Lymphocyte #: 1.8 x10 3/mm (ref 1.0–3.6)
MCH: 28.3 pg (ref 26.0–34.0)
MCHC: 32.8 g/dL (ref 32.0–36.0)
MCV: 86 fL (ref 80–100)
Monocyte #: 0.5 x10 3/mm (ref 0.2–1.0)
Monocyte %: 10.3 %
NEUTROS ABS: 2.7 x10 3/mm (ref 1.4–6.5)
Neutrophil %: 50.9 %
Platelet: 164 x10 3/mm (ref 150–440)
RBC: 5.13 10*6/uL (ref 4.40–5.90)
RDW: 14.3 % (ref 11.5–14.5)
WBC: 5.3 x10 3/mm (ref 3.8–10.6)

## 2014-06-03 LAB — COMPREHENSIVE METABOLIC PANEL
ALT: 32 U/L
ANION GAP: 5 — AB (ref 7–16)
AST: 19 U/L (ref 15–37)
Albumin: 3.9 g/dL (ref 3.4–5.0)
Alkaline Phosphatase: 84 U/L
BUN: 12 mg/dL (ref 7–18)
Bilirubin,Total: 0.4 mg/dL (ref 0.2–1.0)
Calcium, Total: 9.4 mg/dL (ref 8.5–10.1)
Chloride: 102 mmol/L (ref 98–107)
Co2: 29 mmol/L (ref 21–32)
Creatinine: 1.31 mg/dL — ABNORMAL HIGH (ref 0.60–1.30)
GLUCOSE: 211 mg/dL — AB (ref 65–99)
Osmolality: 278 (ref 275–301)
POTASSIUM: 4.3 mmol/L (ref 3.5–5.1)
Sodium: 136 mmol/L (ref 136–145)
Total Protein: 7.4 g/dL (ref 6.4–8.2)

## 2014-06-03 LAB — LACTATE DEHYDROGENASE: LDH: 152 U/L (ref 85–241)

## 2014-06-13 ENCOUNTER — Ambulatory Visit: Payer: Self-pay | Admitting: Hematology and Oncology

## 2014-10-08 IMAGING — CT CT NECK-CHEST W/ CM
2 series · 10 of 14 positions shown, 12 images · IV contrast (isovue)
Comparison: none

REASON FOR EXAM: cervical lymph nodes
COMMENTS:

PROCEDURE:     KCT - KCT NECK AND CHEST WITH CONTRAST  - July 10, 2012 [DATE]
RESULT:     Comparison: Ultrasound of the neck from [HOSPITAL]
06/12/2012.
TECHNIQUE: Multiple axial images were obtained of the neck and chest after
the administration of 135 mL Isovue 370 intravenous contrast. Due to patient
size, the images of the chest were initially acquired followed by repeat
injection and acquisition of the neck images.

[Series 2: chest w/ 3.0 i31f 2 · axial · 0.73mm/px · z∈[-663,-456]mm · 5 of 105 slices shown, 7 images]
[im 18/105  soft-tissue]
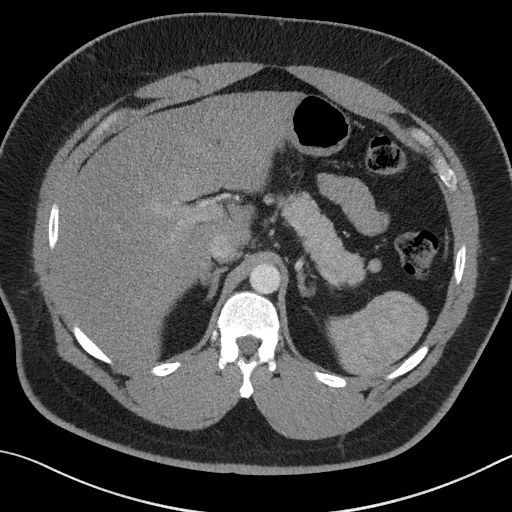
[im 18/105  bone]
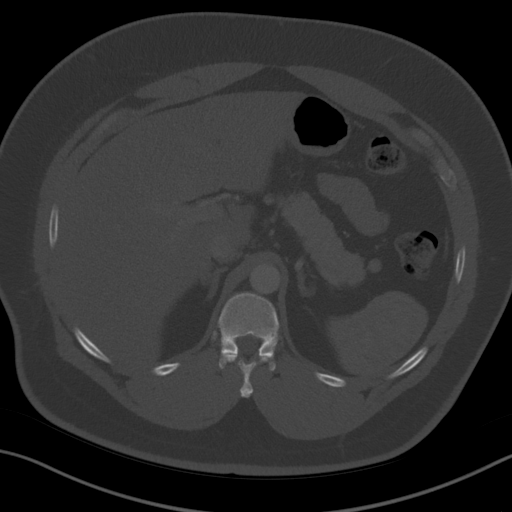
[im 35/105  bone]
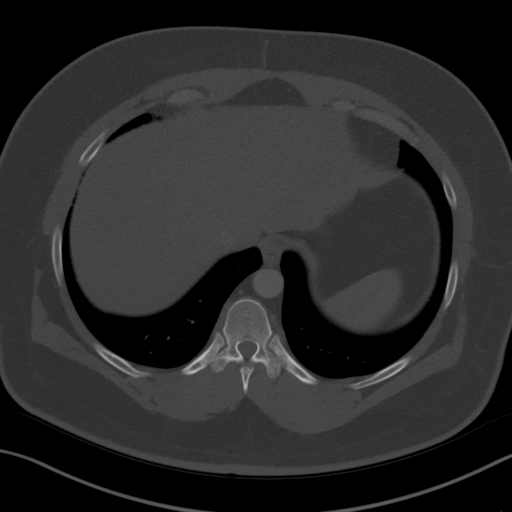
[im 53/105  bone]
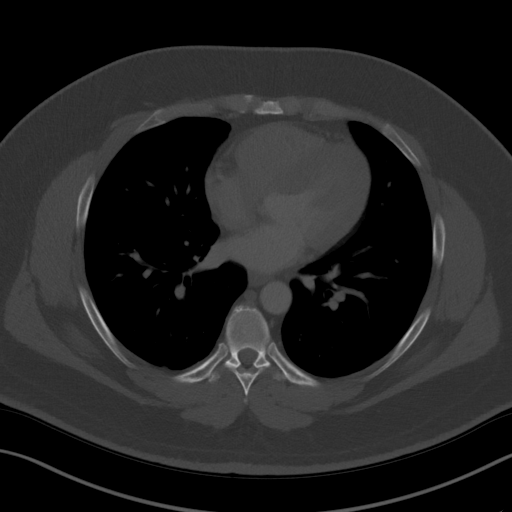
[im 70/105  bone]
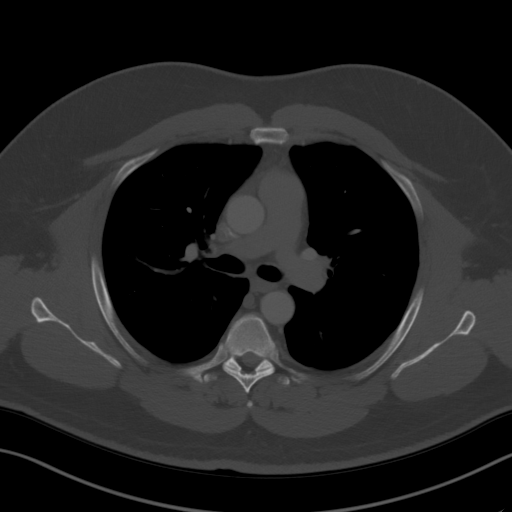
[im 87/105  soft-tissue]
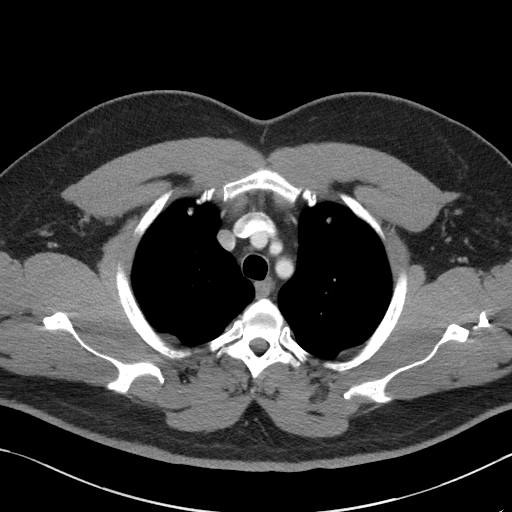
[im 87/105  bone]
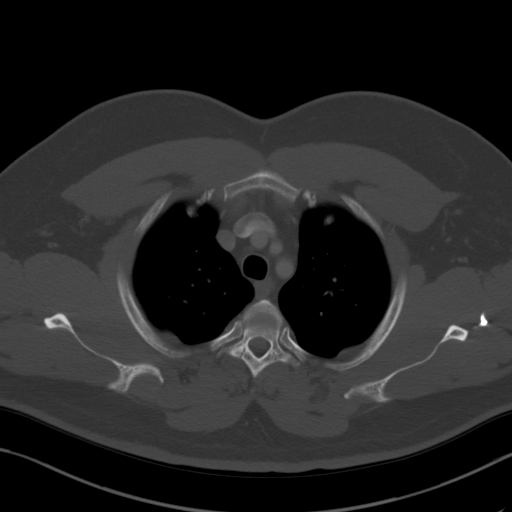

[Series 6: neck 3.0 3 · axial · 0.47mm/px · z∈[-428,-236]mm · 5 of 98 slices shown]
[im 17/98  bone]
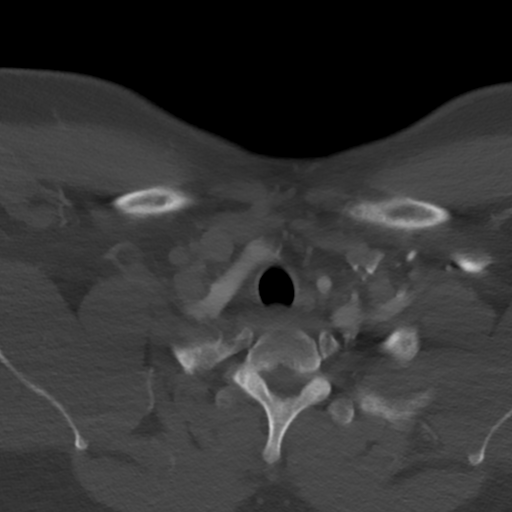
[im 33/98  bone]
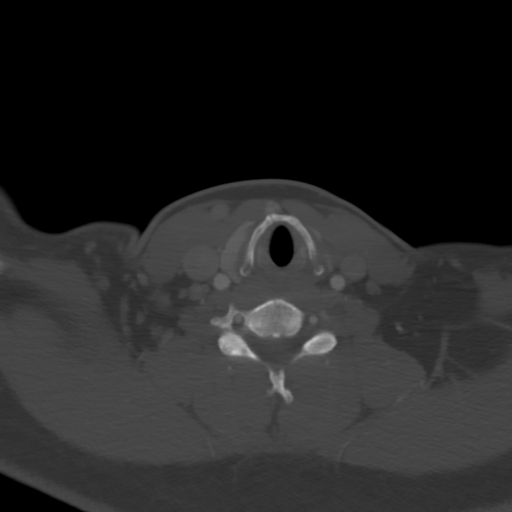
[im 49/98  bone]
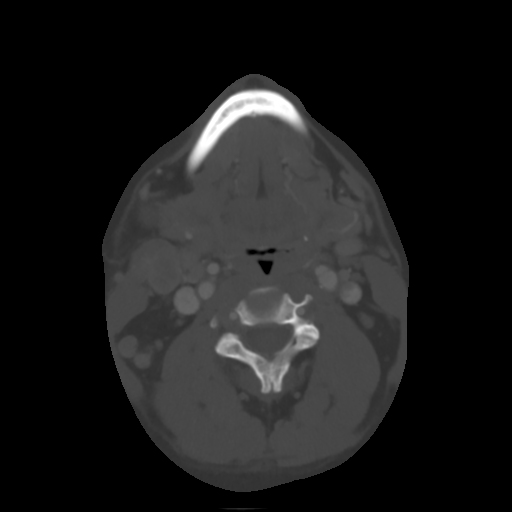
[im 65/98  bone]
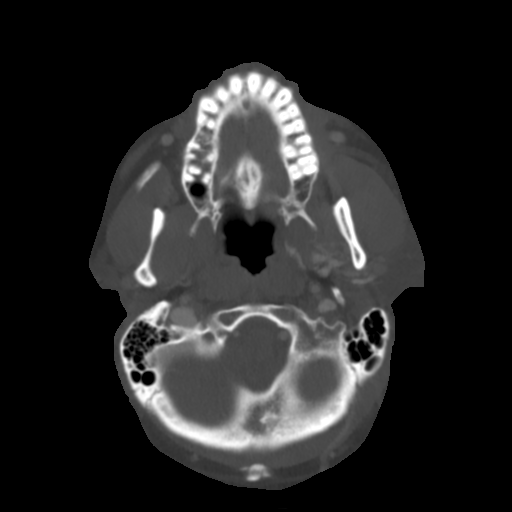
[im 81/98  bone]
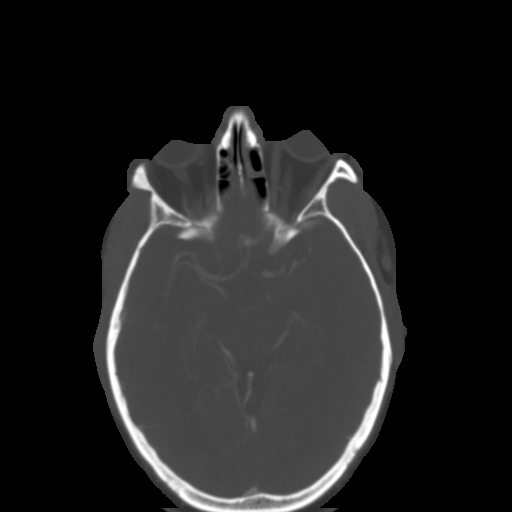

[10 of 14 positions shown; findings below may reference images not displayed]

FINDINGS: There multiple enlarged lymph nodes along the left side of the neck anterior
and posterior to the carotid chain. The largest measures 2.5 x 2.1 cm, just
anterolateral to the jugular vein as seen on image 50. The enlarged lymph
nodes extend from the level of the angle of the mandible to the
supraclavicular fossa. There are several prominent, but not pathologically
enlarged lymph nodes along the left side of the neck. The airway is patent.

No mediastinal, hilar, or axillary lymphadenopathy identified. Minimal
density in the anterior mediastinum likely represents residual thymic tissue.

No aggressive lytic or sclerotic osseous lesions are identified.
IMPRESSION: There are multiple enlarged lymph nodes along the right side of the neck.
These are nonspecific and differential would include infectious,
inflammatory, and malignant etiologies. However, given their size and their
hypoechoic appearance on the outside prior ultrasound, the possibility of
malignancy is raised.

## 2014-10-18 IMAGING — CT NM PET TUM IMG LTD AREA
1 of 5 series · 2 of 25 positions shown · non-contrast
Comparison: none

REASON FOR EXAM: Enlarged lymph nodes rt side of neck
COMMENTS:

[Series 3: ct headneck 2.0 b31s · axial · 2.0mm · 0.98mm/px · z∈[+838,+872]mm · 2 of 188 slices shown]
[im 141/188  brain]
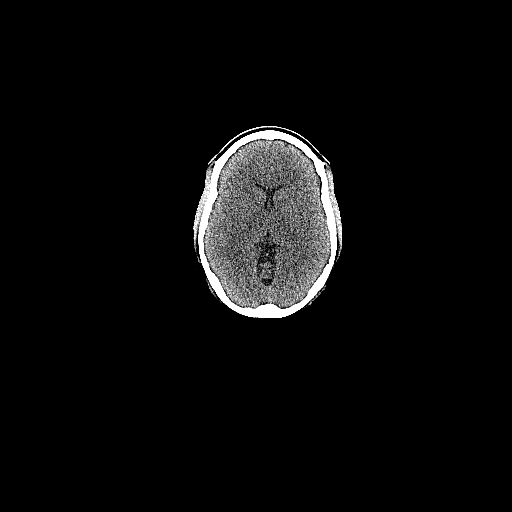
[im 164/188  brain]
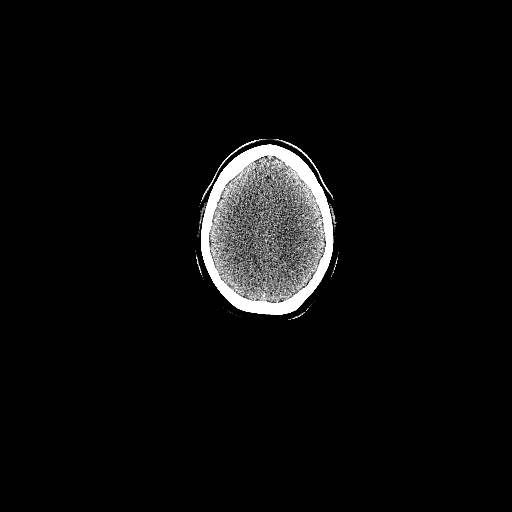

[2 of 25 positions shown; findings below may reference images not displayed]

PROCEDURE:     PET - PET/CT DX HEAD/NECK CA  - July 20, 2012  [DATE]

RESULT:     The patient has a fasting blood glucose measuring 100 mg/dL. The
patient received an injection of 13.04 mCi of fluorine-40 labeled
fluorodeoxyglucose in the left antecubital region at [DATE] p.m. with imaging
from [DATE] to [DATE] hours from the cranial vertex into the thighs with
delayed images over the head and neck between the hours of [DATE] and [DATE].
Noncontrast low-dose CT is performed over the same regions for the purposes
of attenuation, correction and fusion. The noncontrast CT, attenuation
corrected PET images and fused PET/CT data are reconstructed in the axial,
coronal and sagittal planes by the [employer] software which also re-creates
a rotating three-dimensional maximum intensity pixel projection image.

There is a large soft tissue density posterior to the mandible on the right
deep to the inferior parotid showing abnormal F-18 FDG accumulation with a
maximum SUV of 20.87 and a mean of 12.71. Smaller cervical lymph nodes are
seen with abnormal accumulation in the posterior cervical triangle showing a
maximum SUV of 5.35 with a mean of 3.19 SUV. There are some tiny
supraclavicular lymph nodes with abnormal localization on the right
demonstrating a maximum SUV of 3.23 with a mean of 2.27. There is no other
evidence of abnormal localization in the chest, abdomen or pelvis. The
thyroid lobes are unremarkable. There is no definite nasopharyngeal,
oropharyngeal, tonsillar, parapharyngeal or base of tongue abnormal
localization evident. The larynx appears to be unremarkable.
IMPRESSION: Abnormal PET CT with multiple areas of abnormal
localization prominently within what appear to be lymph nodes in the right
cervical and base of neck region extending to the right supraclavicular
region. The largest area appears to represent multiple nodes. One is
abutting the right parotid. Differential considerations could include
salivary gland malignancy although lymphoma or other head and neck
malignancies are not excluded. Correlate with direct visualization for an
underlying mucosal based lesion which is not well seen on this study.

[REDACTED]

## 2014-12-31 NOTE — Op Note (Signed)
PATIENT NAME:  Patrick Davila, HORSCH MR#:  [id number] DATE OF BIRTH:  1970-10-27  DATE OF PROCEDURE:  08/22/2012  PREOPERATIVE DIAGNOSIS: Lymphoma.   POSTOPERATIVE DIAGNOSIS: Lymphoma.    OPERATION PERFORMED: Right selective neck dissection.   SURGEON: Davina Poke, M.D.   ASSISTANT: Karlyne Greenspan, M.D.  ANESTHESIA:  General endotracheal.   OPERATIVE FINDINGS: Approximately 4.5 x 6.5 cm right nest of lymph nodes involving the upper jugulodigastric chain.   DESCRIPTION OF PROCEDURE: Rajesh was identified in the holding area, taken to the Operating Room and placed in the supine position. After general endotracheal anesthesia, the table was turned 90 degrees. The right neck was prepped and draped sterilely. An obvious mass was identified in the jugulodigastric chain on the right. An incision line was marked over the mass. A local anesthetic of 1% lidocaine with 1:100,000 of epinephrine was used to inject along the incision line. A total of 4 mL was used. With the right neck prepped and draped sterilely, a 15 blade was used to incise down to and through the platysma muscle. Hemostasis was achieved using the Bovie cautery. Once the platysma was transected superiorly and inferiorly, base flaps were elevated. Dissecting bluntly through the soft tissue and fat, the mass identified on previous CT scans was encountered. This was a smooth shaped mass, appeared to be of lymph node origin. The dissection proceeded superiorly over the top of the mass adhering to the capsule of this gland as it was retracted superiorly and dissection proceeded deep. Posteriorly, the posterior border of the sternocleidomastoid muscle was identified. The capsule of this mass was adherent to the sternocleidomastoid muscle and was freed up using the microbipolar and the Bovie and the Harmonic scalpel. As the posterior aspect dissected down, it measured approximately 6 cm down into the neck proceeding along the sternocleidomastoid  muscle. Inferiorly, the mass was identified and adhered to around and more medially as it was adherent to the strap muscles medially. This was freed up and pulled medially. The internal jugular vein was identified and this mass was dissected free from the jugular vein. As dissection proceeded superiorly, there were several small bleeding vessels which were divided using the Harmonic scalpel. The jugular vein was then gently retracted anteriorly and this mass was peeled off the carotid sheath and care was taken not to involve any cranial nerve or any major feeding vessels. As the gland was retracted medially, there was a lot of fibrosis where it was stuck down to the structures medially. Superiorly again, the posterior belly of the digastric was identified. This was retracted gently and the gland was peeled off of the deep structures as it was traced inferiorly. Posteriorly again, this was peeled off the sternocleidomastoid muscle which was retracted laterally and the anterior and posterior segments were then connected, removing the entire mass in its entirety. This included what appeared to be lymph node levels 2, 3 and part of 4. With the mass removed, the wound was then copiously irrigated with saline. It measured approximately 4 x 6 cm. Any small bleeding vessels were cauterized using the microbipolar. The wound was copiously irrigated with saline. A #10 TLS drain was brought out of the wound inferiorly. The platysmal layer was closed using interrupted 4-0 Vicryl. The subcutaneous tissues were closed using 4-0 Vicryl and the skin was closed using Dermabond. A Tegaderm was used to affix the TLS drain in position. The patient was then returned to anesthesia where he underwent a Port-A-Cath placement by Dr. Anda Kraft.   ____________________________  Davina Pokehapman T. Frannie Shedrick, MD ctm:ap D: 08/22/2012 17:18:22 ET T: 08/22/2012 17:49:58 ET JOB#: [id number]  cc: Davina Pokehapman T. Saranne Crislip, MD, <Dictator> Davina PokeHAPMAN T Marcene Laskowski  MD ELECTRONICALLY SIGNED 08/25/2012 9:26

## 2015-01-03 NOTE — Consult Note (Signed)
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   Referred by Dr. Kallie Edward.  Diagnosis:  Chief Complaint/Diagnosis   44 year old male with anaplastic large cell lymphomaAlk positivestatus post 3 cycles of chemotherapy with complete [id number]   Pathology Report pathology report reviewed   Imaging Report PET/CT and CT scan reviewed   Referral Report clinical notes reviewed   Planned Treatment Regimen involved field radiation   HPI   patient is a 44 year old malewho presented with a six-month history of increasing cervical adenopathy in the  mid cervical chain. Underwent CT scan showing large lymph nodes at level II  largest of which being 2.8 x 2 cm. CT scan was performed October 17, 2011. PET/CT scan showed multiple areas of abnormal localization prominent women lymph nodes the right cervical and right supraclavicular region.  Excisional biopsy was performed positive for anaplastic large cell lymphoma.. Bone marrow was negative. Tumor wasALK positive patient is undergone 3 cycles of CHOP etoposide finishing his last treatment October 13, 2012. He had some significant problems with his prednisone tolerance. His glipizide was recently increased to twice a day based on elevated hyperglycemic levels. I been asked to evaluate the patient for possible involved field radiation at this time.he had a repeat CT scan of the head and neck region showing complete resolution of his adenopathy. He is seen today in doing well. He specifically denies fever chills night sweats. He is having no dysphagia or head and neck pain.  Past Hx:    lymphoma:    Obesity:    HTN:    Diabetes Mellitus, Type II (NIDD):    Denies surgical history.:   Past, Family and Social History:  Past Medical History positive   Cardiovascular hypertension   Endocrine diabetes mellitus   Family History noncontributory   Social History  noncontributory   Additional Past Medical and Surgical History accompanied by his wife today   Allergies:   No Known Allergies:   Home Meds:  Home Medications: Medication Instructions Status  glipiZIDE 5 mg oral tablet 1 tab(s) orally 2 times a day Active  prochlorperazine 10 mg oral tablet 1 tab(s) orally every 6 hours Active  lisinopril 40 mg oral tablet 1 tab(s) orally once a day (in the morning) Active  Norco 5 mg-325 mg oral tablet 1 -2 tab(s) orally every 4 hours  as needed for pain Active   Review of Systems:  General negative   Performance Status (ECOG) 0   Skin negative   Breast negative   Ophthalmologic negative   ENMT negative   Respiratory and Thorax negative   Cardiovascular negative   Gastrointestinal negative   Genitourinary negative   Musculoskeletal negative   Neurological negative   Psychiatric negative   Hematology/Lymphatics negative   Endocrine negative   Allergic/Immunologic negative   Review of Systems   aaccording to the nurse's notes Patient denies any weight loss, fatigue, weakness, fever, chills or night sweats. Patient denies any loss of vision, blurred vision. Patient denies any ringing  of the ears or hearing loss. No irregular heartbeat. Patient denies heart murmur or history of fainting. Patient denies any chest pain or pain radiating to her upper extremities. Patient denies any shortness of breath, difficulty breathing at night, cough or hemoptysis. Patient denies any swelling in the lower legs. Patient denies any nausea vomiting, vomiting of blood, or coffee ground material in the vomitus. Patient denies any stomach pain. Patient  states has had normal bowel movements no significant constipation or diarrhea. Patient denies any dysuria, hematuria or significant nocturia. Patient denies any problems walking, swelling in the joints or loss of balance. Patient denies any skin changes, loss of hair or loss of weight. Patient denies any  excessive worrying or anxiety or significant depression. Patient denies any problems with insomnia. Patient denies excessive thirst, polyuria, polydipsia. Patient denies any swollen glands, patient denies easy bruising or easy bleeding. Patient denies any recent infections, allergies or URI. Patient "s visual fields have not changed significantly in recent time.  Nursing Notes:  Nursing Vital Signs and Chemo Nursing Nursing Notes: *CC Vital Signs Flowsheet:   25-Feb-14 11:51  Temp Temperature 97.6  Pulse Pulse 81  Respirations Respirations 20  SBP SBP 130  DBP DBP 88  Pain Scale (0-10)  2  Current Weight (kg) (kg) 113.8  Height (cm) centimeters 171.5  BSA (m2) 2.2   Physical Exam:  General/Skin/HEENT:  General normal   Skin normal   Eyes normal   ENMT normal   Head and Neck normal   Additional PE well-developed obese male in NAD. Oral cavity is clear no oral mucosal lesions are identified. Indirect mirror examination shows upper airway clear vallecula and base of tongue within normal limits. Neck is clear without evidence of some digastric cervical or supraclavicular adenopathy. No axillary or other peripheral adenopathy is noted. Lungs are clear to A&P cardiac examination shows regular rate and rhythm.   Breasts/Resp/CV/GI/GU:  Respiratory and Thorax normal   Cardiovascular normal   Gastrointestinal normal   Genitourinary normal   MS/Neuro/Psych/Lymph:  Musculoskeletal normal   Neurological normal   Lymphatics normal   Other Results:  Radiology Results: LabUnknown:    17-Feb-14 10:31, CT Neck and Chest with Contrast  PACS Image   CT:  CT Neck and Chest with Contrast   REASON FOR EXAM:    to assess response to chemo  pt has lymphoma  COMMENTS:       PROCEDURE: KCT - KCT NECK AND CHEST WITH CONTRAST  - Oct 30 2012 10:31AM     RESULT: Axial CT scanning was performed through the neck and chest with   reconstructions at 3 mm intervals and slice thicknesses  following   intravenous administration of 85 cc of Isovue 370. Comparison is made to   a CT scan of the chest and neck dated July 10, 2012. Review of   multiplanar reconstructed images was performed separatelyon the VIA   monitor.    CT scan of the neck: The submandibular and parotid glands are normal in   density and symmetric in size. No anterior or posterior cervical   lymphadenopathy is demonstrated. The previously demonstrated enlarged     lymph nodes have become normal in size. The paranasal sinuses are clear.   The nasal passages are patent. The oropharyngeal and hypopharyngeal soft   tissues appear normal. The laryngeal structures exhibit no acute   abnormality. The thyroid lobes demonstrate no abnormal masses. There is   mild prominence of the thyroid isthmus which is stable. The jugular and   carotid vessels appear normal. The cervical vertebral bodies are   preserved in height.    Conclusion: I do not see findings to suggest active lymphoma within the   neck.    CT scan of the chest: At lung window settings there is no interstitial   nor alveolar infiltrate. There are no pulmonary parenchymal masses. At   mediastinal window settings there is  no lymphadenopathy in the   mediastinum, hilar regions, or axillary regions. There is no pleural nor     pericardial effusion. The caliber of the thoracic aorta is normal. The   thoracic esophagus appears normal. The cardiac chambers are normal in   size. The retrosternal fat is normal in density.    Within the upper abdomen the observed portions of the liver, adrenal   glands, and spleen exhibit no acute abnormalities.    IMPRESSION:   1. The previously demonstrated right-sided cervical lymphadenopathy has   resolved.  2. I do not see lymphadenopathy elsewhere within the neck nor within the   thorax.  3. No acute abnormality otherwise is demonstrated.     Dictation Site: 1    Verified By: DAVID A. Martinique, M.D., MD   Nuclear Med:    [phone] 14:25, PET/CT Scan Head/Neck CA Diagnosis  PET/CT Scan Head/Neck CA Diagnosis   REASON FOR EXAM:    Enlarged lymph nodes rt side of neck  COMMENTS:       PROCEDURE: PET - PET/CT DX HEAD/NECK CA  - Jul 20 2012  2:25PM     RESULT: The patient has a fasting blood glucose measuring 100 mg/dL. The   patient received an injection of 13.04 mCi of fluorine-18 labeled   fluorodeoxyglucose in the left antecubital region at 12:25 p.m. with   imaging from 13:25 to 13:51 hours from the cranial vertex into the thighs   with delayed images over the head and neck between the hours of 13:56 and   14:11. Noncontrast low-dose CT is performed over the same regions for the   purposes of attenuation, correction and fusion. The noncontrast CT,   attenuation corrected PET images and fused PET/CT data are reconstructed   in the axial, coronal and sagittal planes by the [employer] software which   also re-creates a rotating three-dimensional maximum intensity pixel     projection image.    There is a large soft tissue density posterior to the mandible on the   right deep to the inferior parotid showing abnormal F-18 FDG accumulation   with a maximum SUV of 20.87 and a mean of 12.71. Smaller cervical lymph   nodes are seen with abnormal accumulation in the posterior cervical   triangle showing a maximum SUV of 5.35 with a mean of 3.19 SUV. There are   some tiny supraclavicular lymph nodes with abnormal localization on the   right demonstrating a maximum SUV of 3.23 with a mean of 2.27. There is   no other evidence of abnormal localization in the chest, abdomen or   pelvis. The thyroid lobes are unremarkable. There is no definite   nasopharyngeal, oropharyngeal, tonsillar, parapharyngeal or base of   tongue abnormal localization evident. The larynx appears to be   unremarkable.    IMPRESSION:  Abnormal PET CT with multiple areas of abnormal localization   prominently within what  appear to be lymph nodes in the right cervical   and base of neck region extending to the right supraclavicular region.   The largest area appears to represent multiple nodes. One is abutting the   right parotid. Differential considerations could include salivary gland   malignancy although lymphoma or other head and neck malignancies are not   excluded. Correlate with direct visualization for an underlying mucosal   based lesion which is not well seen on this study.    Thank you for the opportunity to contribute to the care of your patient.  Dictation Site: 1          Verified By: Sundra Aland, M.D., MD   Relevent Results:   Relevant Scans and Labs PET/CT and recent CT scan reviewed   Assessment and Plan: Impression:   anaplastic large cell lymphoma  ALK positive status post chemotherapy with complete response Plan:   I have discussed the case personally with Dr. Kallie Edward.. Although there is no definitive answer to the exact number of cycles of chemotherapy patient hasn't  achieved a complete response with 3 cycles of CHOP etoposide. He did have difficulty with his prednisone according to Dr. Kallie Edward. We have discussed  at this time treating with involved field radiation. I would treat up to 2000 cGy to large field head and neck and modified mantle and then boost his cervical nodes and supraclavicular fossa another 1000 cGy brain total to the initial site of involved field to 3000 cGy. Will do treatment planning to try and spare his salivary glands as much as possible. Risks and benefits of treatment including alteration in taste, some xerostomia, dysphagia, alteration of blood counts were all explained to the patient in detail. I have set him up for treatment planning in about a week's time.  I would like to take this opportunity to thank you for allowing me to continue to participate in this patient's care.  Electronic Signatures: Armstead Peaks (MD)  (Signed 04-Mar-14  11:37)  Authored: HPI, Diagnosis, Past Hx, PFSH, Allergies, Home Meds, ROS, Nursing Notes, Physical Exam, Other Results, Relevent Results, Encounter Assessment and Plan   Last Updated: 04-Mar-14 11:37 by Armstead Peaks (MD)
